# Patient Record
Sex: Male | Born: 1980 | Race: Black or African American | Hispanic: No | Marital: Single | Smoking: Current every day smoker
Health system: Southern US, Community
[De-identification: ages and names within clinical notes are randomized; demographics above are authoritative.]

## PROBLEM LIST (undated history)

## (undated) DIAGNOSIS — G8929 Other chronic pain: Secondary | ICD-10-CM

## (undated) DIAGNOSIS — M549 Dorsalgia, unspecified: Secondary | ICD-10-CM

## (undated) HISTORY — PX: FRACTURE SURGERY: SHX138

## (undated) HISTORY — PX: HIP FRACTURE SURGERY: SHX118

---

## 2012-05-17 ENCOUNTER — Ambulatory Visit: Payer: Self-pay | Admitting: Internal Medicine

## 2012-08-22 ENCOUNTER — Emergency Department: Payer: Self-pay

## 2013-11-17 ENCOUNTER — Emergency Department: Payer: Self-pay | Admitting: Emergency Medicine

## 2013-11-17 LAB — URINALYSIS, COMPLETE
BACTERIA: NONE SEEN
BILIRUBIN, UR: NEGATIVE
Blood: NEGATIVE
GLUCOSE, UR: NEGATIVE mg/dL (ref 0–75)
KETONE: NEGATIVE
Leukocyte Esterase: NEGATIVE
Nitrite: NEGATIVE
Ph: 6 (ref 4.5–8.0)
Protein: NEGATIVE
SPECIFIC GRAVITY: 1.011 (ref 1.003–1.030)
Squamous Epithelial: 1
WBC UR: <1 /HPF (ref 0–5)

## 2015-04-09 ENCOUNTER — Emergency Department: Payer: Self-pay

## 2015-04-09 ENCOUNTER — Emergency Department
Admission: EM | Admit: 2015-04-09 | Discharge: 2015-04-09 | Disposition: A | Discharge status: Home / Self Care | Payer: Self-pay | Attending: Emergency Medicine | Admitting: Emergency Medicine

## 2015-04-09 ENCOUNTER — Encounter: Payer: Self-pay | Admitting: Emergency Medicine

## 2015-04-09 DIAGNOSIS — G8929 Other chronic pain: Secondary | ICD-10-CM | POA: Insufficient documentation

## 2015-04-09 DIAGNOSIS — M79605 Pain in left leg: Secondary | ICD-10-CM | POA: Insufficient documentation

## 2015-04-09 DIAGNOSIS — M545 Low back pain: Secondary | ICD-10-CM | POA: Insufficient documentation

## 2015-04-09 LAB — GLUCOSE, CAPILLARY: GLUCOSE-CAPILLARY: 98 mg/dL (ref 65–99)

## 2015-04-09 MED ORDER — MELOXICAM 15 MG PO TABS: 15.0000 mg | ORAL_TABLET | Freq: Every day | ORAL | Status: DC
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
## 2015-04-09 NOTE — Discharge Instructions (Signed)
Follow-up with Dr. Hyacinth Meeker about your back and leg pain. He will need to call and make an appointment with the office. Also establish a doctor in this area. A list of clinics is written on your discharge papers. He'll need to call these offices and most likely fill out paperwork as they charge on a sliding scale based on your income.
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
## 2015-04-09 NOTE — ED Notes (Signed)
Reports pain in left groin and foot.  Reports hx of athletes foot and jock itch x 1 year.

## 2016-01-09 ENCOUNTER — Emergency Department
Admission: EM | Admit: 2016-01-09 | Discharge: 2016-01-09 | Disposition: A | Discharge status: Home / Self Care | Payer: Medicaid - Out of State | Attending: Emergency Medicine | Admitting: Emergency Medicine

## 2016-01-09 ENCOUNTER — Encounter: Payer: Self-pay | Admitting: Emergency Medicine

## 2016-01-09 DIAGNOSIS — S39012A Strain of muscle, fascia and tendon of lower back, initial encounter: Secondary | ICD-10-CM | POA: Insufficient documentation

## 2016-01-09 DIAGNOSIS — Y9389 Activity, other specified: Secondary | ICD-10-CM | POA: Diagnosis not present

## 2016-01-09 DIAGNOSIS — Y929 Unspecified place or not applicable: Secondary | ICD-10-CM | POA: Insufficient documentation

## 2016-01-09 DIAGNOSIS — S3992XA Unspecified injury of lower back, initial encounter: Secondary | ICD-10-CM | POA: Diagnosis present

## 2016-01-09 DIAGNOSIS — Y990 Civilian activity done for income or pay: Secondary | ICD-10-CM | POA: Diagnosis not present

## 2016-01-09 DIAGNOSIS — F17210 Nicotine dependence, cigarettes, uncomplicated: Secondary | ICD-10-CM | POA: Diagnosis not present

## 2016-01-09 DIAGNOSIS — X500XXA Overexertion from strenuous movement or load, initial encounter: Secondary | ICD-10-CM | POA: Insufficient documentation

## 2016-01-09 HISTORY — DX: Other chronic pain: G89.29

## 2016-01-09 HISTORY — DX: Dorsalgia, unspecified: M54.9

## 2016-01-09 MED ORDER — KETOROLAC TROMETHAMINE 30 MG/ML IJ SOLN
30.0000 mg | Freq: Once | INTRAMUSCULAR | Status: AC
  Administered 2016-01-09: 30 mg via INTRAMUSCULAR
  Filled 2016-01-09: qty 1

## 2016-01-09 MED ORDER — MELOXICAM 15 MG PO TABS: 15.0000 mg | ORAL_TABLET | Freq: Every day | ORAL | 0 refills | Status: AC

## 2016-01-09 MED ORDER — CYCLOBENZAPRINE HCL 10 MG PO TABS: 10.0000 mg | ORAL_TABLET | Freq: Three times a day (TID) | ORAL | 0 refills | Status: AC | PRN

## 2016-01-09 NOTE — ED Notes (Signed)
Pt states he has been taking tylenol severe cold or tylenol every 4 hrs.

## 2016-01-09 NOTE — ED Provider Notes (Signed)
[facility] Emergency Department Provider Note  ____________________________________________  Time seen: Approximately 6:42 PM  I have reviewed the triage vital signs and the nursing notes.   HISTORY  Chief Complaint Back Pain and Leg Pain    HPI David Mcclain is a 35 y.o. male who presents to emergency department complaining of lower back pain. Patient states that he has a history of recurring lower back pain. Patient states that typically he developed some spasms. Typically these are resolved with Tylenol but states that this time they've not. Patient also states that he has had some recent cold-like activity with increased coughing which she attributes to worsening the symptoms. Patient denies any radicular symptoms, bowel or bladder suction, saddle anesthesia, paresthesias. No other medications besides Tylenol and over-the-counter cold medication. No direct trauma. No other complaints at this time.   Past Medical History:  Diagnosis Date  . Chronic back pain     There are no active problems to display for this patient.   Past Surgical History:  Procedure Laterality Date  . FRACTURE SURGERY    . HIP FRACTURE SURGERY      Prior to Admission medications   Medication Sig Start Date End Date Taking? Authorizing Provider  cyclobenzaprine (FLEXERIL) 10 MG tablet Take 1 tablet (10 mg total) by mouth 3 (three) times daily as needed for muscle spasms. 01/09/16   Delorise RoyalsJonathan D Bonne Whack, PA-C  meloxicam (MOBIC) 15 MG tablet Take 1 tablet (15 mg total) by mouth daily. 01/09/16   Delorise RoyalsJonathan D Jullian Previti, PA-C    Allergies Review of patient's allergies indicates no known allergies.  History reviewed. No pertinent family history.  Social History Social History  Substance Use Topics  . Smoking status: Current Every Day Smoker    Packs/day: 0.50    Types: Cigarettes  . Smokeless tobacco: Never Used  . Alcohol use Yes     Review of Systems  Constitutional:  No fever/chills Cardiovascular: no chest pain. Respiratory: no cough. No SOB. Gastrointestinal: No abdominal pain.  No nausea, no vomiting.  No diarrhea.  No constipation. Genitourinary: Negative for dysuria. No hematuria Musculoskeletal: Positive for lower back pain Skin: Negative for rash, abrasions, lacerations, ecchymosis. Neurological: Negative for headaches, focal weakness or numbness. 10-point ROS otherwise negative.  ____________________________________________   PHYSICAL EXAM:  VITAL SIGNS: ED Triage Vitals [01/09/16 1619]  Enc Vitals Group     BP 137/83     Pulse Rate 86     Resp 18     Temp 97.8 F (36.6 C)     Temp Source Oral     SpO2 99 %     Weight 227 lb (103 kg)     Height 6\' 1"  (1.854 m)     Head Circumference      Peak Flow      Pain Score 9     Pain Loc      Pain Edu?      Excl. in GC?      Constitutional: Alert and oriented. Well appearing and in no acute distress. Eyes: Conjunctivae are normal. PERRL. EOMI. Head: Atraumatic. Cardiovascular: Normal rate, regular rhythm. Normal S1 and S2.  Good peripheral circulation. Respiratory: Normal respiratory effort without tachypnea or retractions. Lungs CTAB. Good air entry to the bases with no decreased or absent breath sounds. Gastrointestinal: Bowel sounds 4 quadrants. Soft and nontender to palpation. No guarding or rigidity. No palpable masses. No distention. No CVA tenderness. Musculoskeletal: No deformities noted to spine inspection. Full range of motion.  Patient is diffusely tender to palpation left sided paraspinal muscle group. No midline tenderness. No palpable abnormality. No tenderness to palpation of the bilateral sciatic notches. Dorsalis pedis pulse intact distally. Sensation intact and equal lower studies.  Neurologic:  Normal speech and language. No gross focal neurologic deficits are appreciated.  Skin:  Skin is warm, dry and intact. No rash noted. Psychiatric: Mood and affect are normal.  Speech and behavior are normal. Patient exhibits appropriate insight and judgement.   ____________________________________________   LABS (all labs ordered are listed, but only abnormal results are displayed)  Labs Reviewed - No data to display ____________________________________________  EKG   ____________________________________________  RADIOLOGY   No results found.  ____________________________________________    PROCEDURES  Procedure(s) performed:    Procedures    Medications  ketorolac (TORADOL) 30 MG/ML injection 30 mg (30 mg Intramuscular Given 01/09/16 1854)     ____________________________________________   INITIAL IMPRESSION / ASSESSMENT AND PLAN / ED COURSE  Pertinent labs & imaging results that were available during my care of the patient were reviewed by me and considered in my medical decision making (see chart for details).  Review of the [place] CSRS was performed in accordance of the [facility] prior to dispensing any controlled drugs.  Clinical Course    Patient's diagnosis is consistent with Lumbar strain. Patient is exam is reassuring and no indication for imaging at this time. Patient will be given Toradol injection in the emergency department.. Patient will be discharged home with prescriptions for anti-inflammatories and muscle relaxer. Patient is to follow up with primary care or orthopedics as needed or otherwise directed. Patient is given ED precautions to return to the ED for any worsening or new symptoms.     ____________________________________________  FINAL CLINICAL IMPRESSION(S) / ED DIAGNOSES  Final diagnoses:  Strain of lumbar region, initial encounter      NEW MEDICATIONS STARTED DURING THIS VISIT:  New Prescriptions   CYCLOBENZAPRINE (FLEXERIL) 10 MG TABLET    Take 1 tablet (10 mg total) by mouth 3 (three) times daily as needed for muscle spasms.   MELOXICAM (MOBIC) 15 MG TABLET    Take 1 tablet (15 mg total) by mouth  daily.        This chart was dictated using voice recognition software/Dragon. Despite best efforts to proofread, errors can occur which can change the meaning. Any change was purely unintentional.    Racheal PatchesJonathan D Michiel Sivley, PA-C 01/09/16 1856    Jene Everyobert Kinner, MD 01/09/16 2048

## 2016-01-09 NOTE — ED Triage Notes (Signed)
Pt reports he has been having upper and lower back pain for several days. Pt states the pain improves with ambulation and states the pain resembles cramping and soreness.  Pt states he does exercise and lift weights frequently. 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  Pt missed work today.  Pt has been taking Tylenol or Aleve for pain. Last dose of tylenol was at 2pm today.

## 2016-03-09 ENCOUNTER — Encounter: Payer: Self-pay | Admitting: Emergency Medicine

## 2016-03-09 DIAGNOSIS — J302 Other seasonal allergic rhinitis: Secondary | ICD-10-CM | POA: Diagnosis not present

## 2016-03-09 DIAGNOSIS — Z79899 Other long term (current) drug therapy: Secondary | ICD-10-CM | POA: Diagnosis not present

## 2016-03-09 DIAGNOSIS — F17210 Nicotine dependence, cigarettes, uncomplicated: Secondary | ICD-10-CM | POA: Insufficient documentation

## 2016-03-09 DIAGNOSIS — R0981 Nasal congestion: Secondary | ICD-10-CM | POA: Diagnosis present

## 2016-03-09 NOTE — ED Triage Notes (Signed)
Pt ambulatory to triage with c/o nasal congestion and chills for 2 years. Pt states "I have been going back and I think I have a fungal infection, I had athletes foot and jock itch. 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

## 2016-03-10 ENCOUNTER — Emergency Department
Admission: EM | Admit: 2016-03-10 | Discharge: 2016-03-10 | Disposition: A | Discharge status: Home / Self Care | Payer: Medicaid - Out of State | Attending: Emergency Medicine | Admitting: Emergency Medicine

## 2016-03-10 DIAGNOSIS — J302 Other seasonal allergic rhinitis: Secondary | ICD-10-CM

## 2016-03-10 MED ORDER — LORATADINE 10 MG PO TABS: 10.0000 mg | ORAL_TABLET | Freq: Every day | ORAL | 0 refills | Status: AC

## 2016-03-10 NOTE — ED Notes (Signed)
Pt. Reports cold/flu like symptoms for the past two years.  Pt. States some relief with OTC medications.  Pt. Does not report sinus congestion at this time.

## 2016-03-10 NOTE — ED Provider Notes (Signed)
[facility] Emergency Department Provider Note   First MD Initiated Contact with Patient 03/10/16 0157     (approximate)  I have reviewed the triage vital signs and the nursing notes.   HISTORY  Chief Complaint Nasal Congestion and Chills   HPI David Mcclain is a 35 y.o. male patient presents to the emergency department with cold-like symptoms intermittently 2 years. Patient denies any current congestion cough fever chills. Patient does state that however intermittently he does get congested   Past Medical History:  Diagnosis Date  . Chronic back pain     There are no active problems to display for this patient.   Past Surgical History:  Procedure Laterality Date  . FRACTURE SURGERY    . HIP FRACTURE SURGERY      Prior to Admission medications   Medication Sig Start Date End Date Taking? Authorizing Provider  cyclobenzaprine (FLEXERIL) 10 MG tablet Take 1 tablet (10 mg total) by mouth 3 (three) times daily as needed for muscle spasms. 01/09/16   Delorise RoyalsJonathan D Cuthriell, PA-C  loratadine (CLARITIN) 10 MG tablet Take 1 tablet (10 mg total) by mouth daily. 03/10/16   Darci Currentandolph N Brown, MD  meloxicam (MOBIC) 15 MG tablet Take 1 tablet (15 mg total) by mouth daily. 01/09/16   Delorise RoyalsJonathan D Cuthriell, PA-C    Allergies Patient has no known allergies.  No family history on file.  Social History Social History  Substance Use Topics  . Smoking status: Current Every Day Smoker    Packs/day: 0.50    Types: Cigarettes  . Smokeless tobacco: Never Used  . Alcohol use Yes    Review of Systems Constitutional: No fever/chills Eyes: No visual changes. ENT: No sore throat. Cardiovascular: Denies chest pain. Respiratory: Denies shortness of breath. Gastrointestinal: No abdominal pain.  No nausea, no vomiting.  No diarrhea.  No constipation. Genitourinary: Negative for dysuria. Musculoskeletal: Negative for back pain. Skin: Negative for  rash. Neurological: Negative for headaches, focal weakness or numbness.  10-point ROS otherwise negative.  ____________________________________________   PHYSICAL EXAM:  VITAL SIGNS: ED Triage Vitals  Enc Vitals Group     BP 03/09/16 2327 (!) 152/95     Pulse Rate 03/09/16 2327 94     Resp 03/09/16 2327 18     Temp 03/09/16 2327 98 F (36.7 C)     Temp Source 03/09/16 2327 Oral     SpO2 03/09/16 2327 97 %     Weight 03/09/16 2329 210 lb (95.3 kg)     Height 03/09/16 2329 6\' 1"  (1.854 m)     Head Circumference --      Peak Flow --      Pain Score 03/09/16 2329 3     Pain Loc --      Pain Edu? --      Excl. in GC? --     Constitutional: Alert and oriented. Well appearing and in no acute distress. Eyes: Conjunctivae are normal. PERRL. EOMI. Head: Atraumatic. Ears:  Healthy appearing ear canals and TMs bilaterally Nose: No congestion/rhinnorhea. Mouth/Throat: Mucous membranes are moist.  Oropharynx non-erythematous. Neck: No stridor.   Cardiovascular: Normal rate, regular rhythm. Good peripheral circulation. Grossly normal heart sounds. Respiratory: Normal respiratory effort.  No retractions. Lungs CTAB. Gastrointestinal: Soft and nontender. No distention.  Musculoskeletal: No lower extremity tenderness nor edema. No gross deformities of extremities. Neurologic:  Normal speech and language. No gross focal neurologic deficits are appreciated.  Skin:  Skin is warm, dry and intact.  No rash noted.    Procedures     INITIAL IMPRESSION / ASSESSMENT AND PLAN / ED COURSE  Pertinent labs & imaging results that were available during my care of the patient were reviewed by me and considered in my medical decision making (see chart for details).     Clinical Course     ____________________________________________  FINAL CLINICAL IMPRESSION(S) / ED DIAGNOSES  Final diagnoses:  Acute seasonal allergic rhinitis, unspecified trigger     MEDICATIONS GIVEN DURING THIS  VISIT:  Medications - No data to display   NEW OUTPATIENT MEDICATIONS STARTED DURING THIS VISIT:  New Prescriptions   LORATADINE (CLARITIN) 10 MG TABLET    Take 1 tablet (10 mg total) by mouth daily.    Modified Medications   No medications on file    Discontinued Medications   No medications on file     Note:  This document was prepared using Dragon voice recognition software and may include unintentional dictation errors.    Darci Currentandolph N Brown, MD 03/10/16 [phone]

## 2016-03-10 NOTE — ED Notes (Signed)
Pt. Going home by self. 

## 2016-11-09 ENCOUNTER — Emergency Department
Admission: EM | Admit: 2016-11-09 | Discharge: 2016-11-09 | Disposition: A | Discharge status: Home / Self Care | Payer: Medicaid - Out of State | Attending: Emergency Medicine | Admitting: Emergency Medicine

## 2016-11-09 ENCOUNTER — Emergency Department: Payer: Medicaid - Out of State

## 2016-11-09 DIAGNOSIS — F17210 Nicotine dependence, cigarettes, uncomplicated: Secondary | ICD-10-CM | POA: Diagnosis not present

## 2016-11-09 DIAGNOSIS — M549 Dorsalgia, unspecified: Secondary | ICD-10-CM | POA: Diagnosis not present

## 2016-11-09 DIAGNOSIS — Z79899 Other long term (current) drug therapy: Secondary | ICD-10-CM | POA: Insufficient documentation

## 2016-11-09 DIAGNOSIS — R05 Cough: Secondary | ICD-10-CM | POA: Diagnosis present

## 2016-11-09 DIAGNOSIS — J189 Pneumonia, unspecified organism: Secondary | ICD-10-CM | POA: Diagnosis not present

## 2016-11-09 DIAGNOSIS — G8929 Other chronic pain: Secondary | ICD-10-CM | POA: Insufficient documentation

## 2016-11-09 MED ORDER — LEVOFLOXACIN 500 MG PO TABS: 500.0000 mg | ORAL_TABLET | Freq: Every day | ORAL | 0 refills | Status: AC

## 2016-11-09 MED ORDER — LEVOFLOXACIN 500 MG PO TABS
500.0000 mg | ORAL_TABLET | Freq: Once | ORAL | Status: AC
  Administered 2016-11-09: 500 mg via ORAL
  Filled 2016-11-09: qty 1

## 2016-11-09 MED ORDER — AZITHROMYCIN 500 MG PO TABS: 500.0000 mg | ORAL_TABLET | Freq: Every day | ORAL | 0 refills | Status: AC

## 2016-11-09 NOTE — ED Provider Notes (Signed)
[facility] Emergency Department Provider Note   First MD Initiated Contact with Patient 11/09/16 [phone]     (approximate)  I have reviewed the triage vital signs and the nursing notes.   HISTORY  Chief Complaint Back Pain    HPI David Mcclain is a 36 y.o. male with history of sciatica presents to the emergency department with chest congestion, chills times approximately 3 days. Patient denies any fever. Patient does admit to a nonproductive cough in the morning. Patient also admits to malodorous scent coming from his air conditioning unit for "a while". Patient denies any chest pain. Patient denies any E pain or swelling.   Past Medical History:  Diagnosis Date  . Chronic back pain     There are no active problems to display for this patient.   Past Surgical History:  Procedure Laterality Date  . FRACTURE SURGERY    . HIP FRACTURE SURGERY      Prior to Admission medications   Medication Sig Start Date End Date Taking? Authorizing Provider  azithromycin (ZITHROMAX) 500 MG tablet Take 1 tablet (500 mg total) by mouth daily. Take 1 tablet daily for 3 days. 11/09/16 11/12/16  Darci Current, MD  cyclobenzaprine (FLEXERIL) 10 MG tablet Take 1 tablet (10 mg total) by mouth 3 (three) times daily as needed for muscle spasms. 01/09/16   Cuthriell, Delorise Royals, PA-C  levofloxacin (LEVAQUIN) 500 MG tablet Take 1 tablet (500 mg total) by mouth daily. 11/09/16 11/19/16  Darci Current, MD  levofloxacin (LEVAQUIN) 500 MG tablet Take 1 tablet (500 mg total) by mouth daily. 11/09/16 11/19/16  Darci Current, MD  loratadine (CLARITIN) 10 MG tablet Take 1 tablet (10 mg total) by mouth daily. 03/10/16   Darci Current, MD  meloxicam (MOBIC) 15 MG tablet Take 1 tablet (15 mg total) by mouth daily. 01/09/16   Cuthriell, Delorise Royals, PA-C    Allergies No known drug allergies  No family history on file.  Social History Social History  Substance Use Topics  .  Smoking status: Current Every Day Smoker    Packs/day: 0.50    Types: Cigarettes  . Smokeless tobacco: Never Used  . Alcohol use Yes    Review of Systems Constitutional: Positive for chills Eyes: No visual changes. ENT: No sore throat. Cardiovascular: Denies chest pain. Respiratory: Denies shortness of breath.Positive for cough and dyspnea Gastrointestinal: No abdominal pain.  No nausea, no vomiting.  No diarrhea.  No constipation. Genitourinary: Negative for dysuria. Musculoskeletal: Negative for neck pain.  Negative for back pain. Integumentary: Negative for rash. Neurological: Negative for headaches, focal weakness or numbness.   ____________________________________________   PHYSICAL EXAM:  VITAL SIGNS: ED Triage Vitals  Enc Vitals Group     BP 11/09/16 0031 134/89     Pulse Rate 11/09/16 0031 81     Resp 11/09/16 0031 18     Temp 11/09/16 0031 98.2 F (36.8 C)     Temp Source 11/09/16 0031 Oral     SpO2 11/09/16 0031 100 %     Weight 11/09/16 0032 103 kg (227 lb)     Height 11/09/16 0032 1.854 m (6\' 1" )     Head Circumference --      Peak Flow --      Pain Score 11/09/16 0030 2     Pain Loc --      Pain Edu? --      Excl. in GC? --     Constitutional: Alert  and oriented. Well appearing and in no acute distress. Eyes: Conjunctivae are normal.  Head: Atraumatic. Nose: No congestion/rhinnorhea. Mouth/Throat: Mucous membranes are moist.  Oropharynx non-erythematous. Neck: No stridor.  Cardiovascular: Normal rate, regular rhythm. Good peripheral circulation. Grossly normal heart sounds. Respiratory: Normal respiratory effort.  No retractions. Diffuse rhonchi Gastrointestinal: Soft and nontender. No distention.  Musculoskeletal: No lower extremity tenderness nor edema. No gross deformities of extremities. Neurologic:  Normal speech and language. No gross focal neurologic deficits are appreciated.  Skin:  Skin is warm, dry and intact. No rash noted. Psychiatric:  Mood and affect are normal. Speech and behavior are normal.   RADIOLOGY I, Lares N Ariani Seier, personally viewed and evaluated these images (plain radiographs) as part of my medical decision making, as well as reviewing the written report by the radiologist.  Dg Chest 2 View  Result Date: 11/09/2016 CLINICAL DATA:  Chest tightness and congestion tonight EXAM: CHEST  2 VIEW COMPARISON:  None. FINDINGS: Diffuse small nodules, upper lobe predominant. This may be infectious or inflammatory. Neoplasm less likely. Relative sparing of the bases. Hilar, mediastinal and cardiac contours are unremarkable. Normal pulmonary vasculature. No pleural effusions. IMPRESSION: Patchy and small nodular opacities, upper lobe predominant. Infectious or inflammatory etiologies are most likely. Electronically Signed   By: Ellery Plunkaniel R Mitchell M.D.   On: 11/09/2016 06:15   Ct Chest Wo Contrast  Result Date: 11/09/2016 CLINICAL DATA:  Chronic lower back pain.  Chest and shoulder pain. EXAM: CT CHEST WITHOUT CONTRAST TECHNIQUE: Multidetector CT imaging of the chest was performed following the standard protocol without IV contrast. COMPARISON:  Radiographs [id number] FINDINGS: Cardiovascular: Normal heart and aorta.  No pericardial effusion. Mediastinum/Nodes: Bilateral hilar adenopathy. Mildly prominent mediastinal nodes. Lungs/Pleura: Multifocal patchy and nodular opacities, upper lobe predominant. This could be infectious or inflammatory. Neoplasm less likely but not entirely excluded. Airways are normal in caliber and patent. No pleural effusion. Upper Abdomen: No significant abnormality. Musculoskeletal: No significant skeletal abnormality. IMPRESSION: Scattered patchy and nodular airspace opacities bilaterally, upper lobe predominant. Bilateral hilar adenopathy may be reactive. This may represent an atypical infectious process. Neoplasm less likely. Electronically Signed   By: Ellery Plunkaniel R Mitchell M.D.   On: 11/09/2016 06:45       Procedures   ____________________________________________   INITIAL IMPRESSION / ASSESSMENT AND PLAN / ED COURSE  Pertinent labs & imaging results that were available during my care of the patient were reviewed by me and considered in my medical decision making (see chart for details).  36 year old male presenting with chills cough and chest congestion with above findings noted on chest x-ray. CT scan of chest perform for better visualization of chest x-ray findings. Patient will be prescribed Levaquin and azithromycin. I spoke with the patient at length regarding necessity of following up with pulmonologist. Concern for possible fungal etiology as well and a such patient states urged to follow up with Dr. Meredeth IdeFleming      ____________________________________________  FINAL CLINICAL IMPRESSION(S) / ED DIAGNOSES  Final diagnoses:  Atypical pneumonia     MEDICATIONS GIVEN DURING THIS VISIT:  Medications  levofloxacin (LEVAQUIN) tablet 500 mg (500 mg Oral Given 11/09/16 0705)     NEW OUTPATIENT MEDICATIONS STARTED DURING THIS VISIT:  Discharge Medication List as of 11/09/2016  6:58 AM    START taking these medications   Details  azithromycin (ZITHROMAX) 500 MG tablet Take 1 tablet (500 mg total) by mouth daily. Take 1 tablet daily for 3 days., Starting Wed 11/09/2016, Until Sat 11/12/2016, Print    !!  levofloxacin (LEVAQUIN) 500 MG tablet Take 1 tablet (500 mg total) by mouth daily., Starting Wed 11/09/2016, Until Sat 11/19/2016, Print    !! levofloxacin (LEVAQUIN) 500 MG tablet Take 1 tablet (500 mg total) by mouth daily., Starting Wed 11/09/2016, Until Sat 11/19/2016, Print     !! - Potential duplicate medications found. Please discuss with provider.      Discharge Medication List as of 11/09/2016  6:58 AM      Discharge Medication List as of 11/09/2016  6:58 AM       Note:  This document was prepared using Dragon voice recognition software and may include  unintentional dictation errors.    Darci Current, MD 11/09/16 [id number]

## 2016-11-09 NOTE — ED Notes (Signed)
Patient having multiple issues. Patient having lower back pain which is chronic due to sciatica. Patient having cold like symptoms, having shoulder pain and chest pain that is relieved with doing push ups. Patient NAD. Patient is taking a lot of cold medication and multiple allergy pills a day. Patient [facility] has a "weird" smell and is concerned it is mold.

## 2016-11-09 NOTE — ED Triage Notes (Signed)
Patient to ED for "traveling pain." States he has been here a lot and the pain is not going away. He has pain that starts in his leg, goes to his back and then sciatica. Patient does not have a PCP "that 's why I'm here all the time".

## 2018-07-10 ENCOUNTER — Encounter: Payer: Self-pay | Admitting: Emergency Medicine

## 2018-07-10 ENCOUNTER — Other Ambulatory Visit: Payer: Self-pay

## 2018-07-10 ENCOUNTER — Emergency Department: Payer: Commercial Managed Care - PPO

## 2018-07-10 ENCOUNTER — Emergency Department
Admission: EM | Admit: 2018-07-10 | Discharge: 2018-07-11 | Disposition: A | Discharge status: Home / Self Care | Payer: Commercial Managed Care - PPO | Attending: Emergency Medicine | Admitting: Emergency Medicine

## 2018-07-10 DIAGNOSIS — R05 Cough: Secondary | ICD-10-CM | POA: Diagnosis not present

## 2018-07-10 DIAGNOSIS — F17210 Nicotine dependence, cigarettes, uncomplicated: Secondary | ICD-10-CM | POA: Diagnosis not present

## 2018-07-10 DIAGNOSIS — R0602 Shortness of breath: Secondary | ICD-10-CM | POA: Diagnosis present

## 2018-07-10 NOTE — ED Notes (Signed)
XR at bedside

## 2018-07-10 NOTE — ED Triage Notes (Signed)
Patient ambulatory to triage with steady gait, without difficulty or distress noted, mask in place; pt reports having frequent cold symptoms and SHOB x year with occas cough and congestion; st hx of same with allergies

## 2018-07-11 MED ORDER — ALBUTEROL SULFATE HFA 108 (90 BASE) MCG/ACT IN AERS: INHALATION_SPRAY | RESPIRATORY_TRACT | 1 refills | Status: AC

## 2018-07-11 NOTE — ED Notes (Signed)
Pt verbalized understanding of d/c instructions, RX, and f/u care. No further questions at this time. Pt ambulatory to the exit with steady gait  

## 2018-07-11 NOTE — ED Provider Notes (Addendum)
[facility] Emergency Department Provider Note  ____________________________________________   First MD Initiated Contact with Patient 07/10/18 2341     (approximate)  I have reviewed the triage vital signs and the nursing notes.   HISTORY  Chief Complaint Shortness of Breath    HPI David Mcclain is a 38 y.o. male with medical history as listed below who presents for evaluation of intermittent shortness of breath with mild cough that is been going on for about a year.  He states he was seen in 2018 for similar symptoms and got a couple of antibiotics.  The symptoms got better but intermittently he has issues that he thinks are associated with his allergies.  He felt little bit more short of breath today although nothing in particular made it better or worse.  He said he thought he should get it checked out because he knew that there is probably less people in the emergency department right now than usual.  He has been trying to isolate himself during the COVID-19 pandemic.  He denies fever/chills, sore throat, chest pain, nausea, vomiting, and abdominal pain.  He smokes daily and frequently has a mild occasionally productive cough.  He has some episodes of shortness of breath from time to time over the last year.  He is in no distress at this time and says he does not currently feel short of breath.         Past Medical History:  Diagnosis Date  . Chronic back pain     There are no active problems to display for this patient.   Past Surgical History:  Procedure Laterality Date  . FRACTURE SURGERY    . HIP FRACTURE SURGERY      Prior to Admission medications   Medication Sig Start Date End Date Taking? Authorizing Provider  albuterol (VENTOLIN HFA) 108 (90 Base) MCG/ACT inhaler Inhale 2-4 puffs by mouth every 4 hours as needed for wheezing, cough, and/or shortness of breath 07/11/18   Loleta Rose, MD  cyclobenzaprine (FLEXERIL) 10 MG tablet Take 1  tablet (10 mg total) by mouth 3 (three) times daily as needed for muscle spasms. 01/09/16   Cuthriell, Delorise Royals, PA-C  loratadine (CLARITIN) 10 MG tablet Take 1 tablet (10 mg total) by mouth daily. 03/10/16   Darci Current, MD  meloxicam (MOBIC) 15 MG tablet Take 1 tablet (15 mg total) by mouth daily. 01/09/16   Cuthriell, Delorise Royals, PA-C    Allergies Patient has no known allergies.  No family history on file.  Social History Social History   Tobacco Use  . Smoking status: Current Every Day Smoker    Packs/day: 0.50    Types: Cigarettes  . Smokeless tobacco: Never Used  Substance Use Topics  . Alcohol use: Yes  . Drug use: Not on file    Review of Systems Constitutional: No fever/chills Eyes: No visual changes. ENT: No sore throat. Cardiovascular: Denies chest pain. Respiratory: Intermittent shortness of breath and cough for about a year. Gastrointestinal: No abdominal pain.  No nausea, no vomiting.  No diarrhea.  No constipation. Genitourinary: Negative for dysuria. Musculoskeletal: Negative for neck pain.  Negative for back pain. Integumentary: Negative for rash. Neurological: Negative for headaches, focal weakness or numbness.   ____________________________________________   PHYSICAL EXAM:  VITAL SIGNS: ED Triage Vitals  Enc Vitals Group     BP 07/10/18 2230 132/89     Pulse Rate 07/10/18 2230 87     Resp 07/10/18 2230 18  Temp 07/10/18 2230 98.4 F (36.9 C)     Temp Source 07/10/18 2230 Oral     SpO2 07/10/18 2230 97 %     Weight 07/10/18 2231 97.5 kg (215 lb)     Height 07/10/18 2231 1.854 m ( )     Head Circumference --      Peak Flow --      Pain Score 07/10/18 2229 0     Pain Loc --      Pain Edu? --      Excl. in GC? --     Constitutional: Alert and oriented. Well appearing and in no acute distress. Eyes: Conjunctivae are normal.  Head: Atraumatic. Nose: No congestion/rhinnorhea. Mouth/Throat: Mucous membranes are moist. Neck:  No stridor.  No meningeal signs.   Cardiovascular: Normal rate, regular rhythm. Good peripheral circulation. Grossly normal heart sounds. Respiratory: Normal respiratory effort.  No retractions. No audible wheezing. Gastrointestinal: Soft and nontender. No distention.  Musculoskeletal: No lower extremity tenderness nor edema. No gross deformities of extremities. Neurologic:  Normal speech and language. No gross focal neurologic deficits are appreciated.  Skin:  Skin is warm, dry and intact. No rash noted. Psychiatric: Mood and affect are normal. Speech and behavior are normal.  ____________________________________________   LABS (all labs ordered are listed, but only abnormal results are displayed)  Labs Reviewed - No data to display ____________________________________________  EKG  ED ECG REPORT I, Loleta Rose, the attending physician, personally viewed and interpreted this ECG.  Date: 07/10/2018 EKG Time: 22:31 Rate: 98 Rhythm: normal sinus rhythm QRS Axis: normal Intervals: normal ST/T Wave abnormalities: Non-specific ST segment / T-wave changes, but no clear evidence of acute ischemia. Narrative Interpretation: no definitive evidence of acute ischemia; does not meet STEMI criteria.   ____________________________________________  RADIOLOGY I, Loleta Rose, personally viewed and evaluated these images (plain radiographs) as part of my medical decision making, as well as reviewing the written report by the radiologist.  ED MD interpretation: Improved x-ray compared to prior from 2018.  No indication of acute abnormality.  Official radiology report(s): Dg Chest Portable 1 View  Result Date: 07/10/2018 CLINICAL DATA:  Intermittent cough shortness of breath for 1 year. EXAM: PORTABLE CHEST 1 VIEW COMPARISON:  11/09/2016 FINDINGS: The heart size and mediastinal contours are within normal limits. Previously seen small patchy nodular opacity both upper lobes show improvement  since previous study. No new or worsening areas of pulmonary opacity are seen. No evidence of pleural effusion. IMPRESSION: Interval improvement in bilateral upper lobe patchy nodular opacities since prior exam. No acute findings. Electronically Signed   By: Myles Rosenthal M.D.   On: 07/10/2018 23:35    ____________________________________________   PROCEDURES   Procedure(s) performed (including Critical Care):  Procedures   ____________________________________________   INITIAL IMPRESSION / MDM / ASSESSMENT AND PLAN / ED COURSE  As part of my medical decision making, I reviewed the following data within the electronic MEDICAL RECORD NUMBER Nursing notes reviewed and incorporated, Old chart reviewed, Radiograph reviewed , Notes from prior ED visits and [place] Controlled Substance Database      David Mcclain was evaluated in Emergency Department on 07/11/2018 for the symptoms described in the history of present illness. He was evaluated in the context of the global COVID-19 pandemic, which necessitated consideration that the patient might be at risk for infection with the SARS-CoV-2 virus that causes COVID-19. Institutional protocols and algorithms that pertain to the evaluation of patients at risk for COVID-19 are in a state  of rapid change based on information released by regulatory bodies including the CDC and federal and state organizations. These policies and algorithms were followed during the patient's care in the ED.  Differential diagnosis includes, but is not limited to, allergies, viral infection, COVID-19, pneumonia.  The patient is well-appearing and in no distress with normal vital signs.  He is having no dyspnea at this time.  He is a daily smoker and I suspect he suffers both from some tobacco related cough as well as seasonal allergies.  He says he feels better when he takes his daily Claritin, but "I almost never take it unless I start feeling bad".  I encouraged him to continue taking  his daily allergy medicine and given his high probability of developing COPD if he does not already have it, I am giving him a prescription for an inhaler.  His chest x-ray is reassuring compared to the last one.  He is low risk for COVID-19 and I did offer him the send out test just to verify that he is negative, but he does not want the swab in his nose and says that his symptoms have been about the same for a year and there is no reason to think he has it.  I agree but I continue to support the notion that he should isolate himself from others.  I gave him my usual customary return precautions and he understands and agrees with the plan.      ____________________________________________  FINAL CLINICAL IMPRESSION(S) / ED DIAGNOSES  Final diagnoses:  Shortness of breath     MEDICATIONS GIVEN DURING THIS VISIT:  Medications - No data to display   ED Discharge Orders         Ordered    albuterol (VENTOLIN HFA) 108 (90 Base) MCG/ACT inhaler     07/11/18 0022           Note:  This document was prepared using Dragon voice recognition software and may include unintentional dictation errors.   Loleta RoseForbach, Enyah Moman, MD 07/11/18 Moses [id number]    Loleta RoseForbach, Damondre Pfeifle, MD 07/16/18 [phone]

## 2018-07-11 NOTE — ED Notes (Signed)
Provider at bedside

## 2018-07-11 NOTE — Discharge Instructions (Addendum)
As we discussed, your symptoms are most likely the result of your smoking and some seasonal allergies.  I recommend you continue to take your Claritin daily.  I also wrote a prescription for an albuterol inhaler which you can use as needed.  We offered COVID-19 testing which she declined at this time and I think that is reasonable given that you have no particular risk factors.  We recommend you continue to try to isolate from others during this pandemic.  Return to the emergency department if you develop new or worsening symptoms that concern you.

## 2018-09-20 ENCOUNTER — Telehealth: Payer: Self-pay

## 2018-09-20 NOTE — Telephone Encounter (Signed)
Copied from [place] [phone]. Topic: General - Other >> Sep 19, 2018  4:49 PM Ivar Drape wrote: Reason for CRM:   Patient would like a call back to make a New Patient appt with a male provider.

## 2018-10-04 ENCOUNTER — Telehealth: Payer: Self-pay | Admitting: Internal Medicine

## 2018-10-04 NOTE — Telephone Encounter (Signed)
I called pt to schedule NP appt. Pt insurance was not going into epic pt was being rude example reading numbers off card fast, mouth away from phone, rude comment. I asked pt if he could bring card into office. But I did say to pt that he wont be able to come into the office for appt he then says well so you mean to tell me I can't come into the office but I can bring a card in. I ask pt to hold on and says im not playing no games with you an hung up.

## 2019-01-03 IMAGING — CT CT CHEST W/O CM
2 of 3 series · 15 of 36 positions shown, 18 images · non-contrast
Comparison: Radiographs [id number]

CLINICAL DATA: Chronic lower back pain.  Chest and shoulder pain.

EXAM:
CT CHEST WITHOUT CONTRAST
TECHNIQUE: Multidetector CT imaging of the chest was performed following the
standard protocol without IV contrast.

[Series 2: thorax · axial · 0.77mm/px · z∈[-525,-259]mm · 12 of 157 slices shown, 15 images]
[im 12/157  mediastinal]
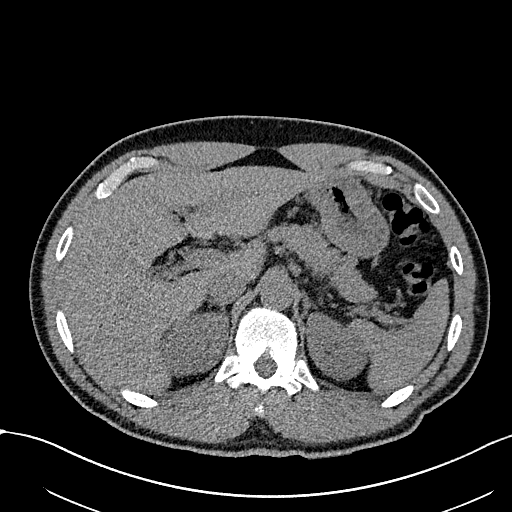
[im 12/157  lung]
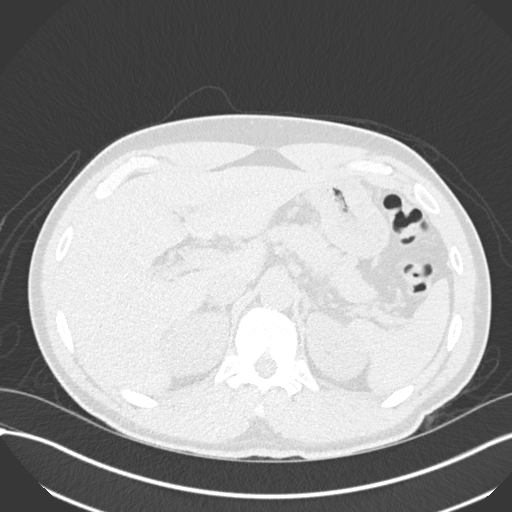
[im 24/157  lung]
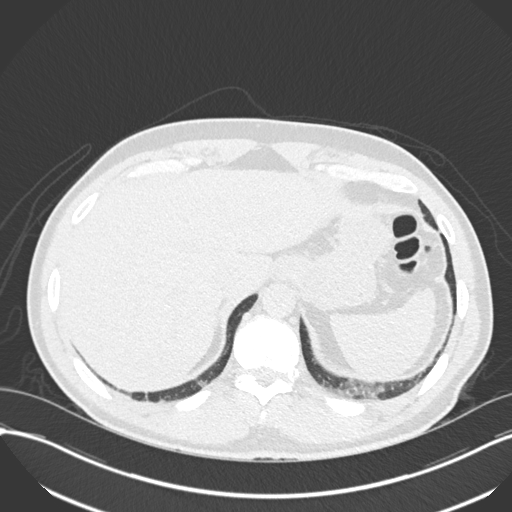
[im 35/157  lung]
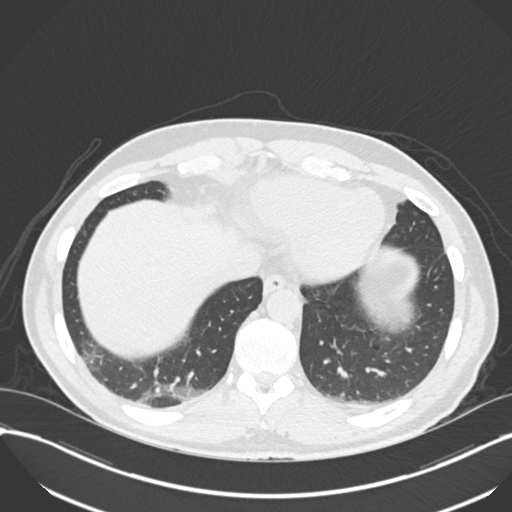
[im 47/157  lung]
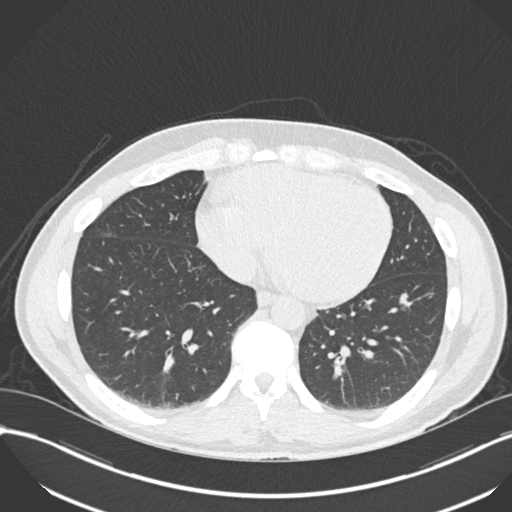
[im 58/157  mediastinal]
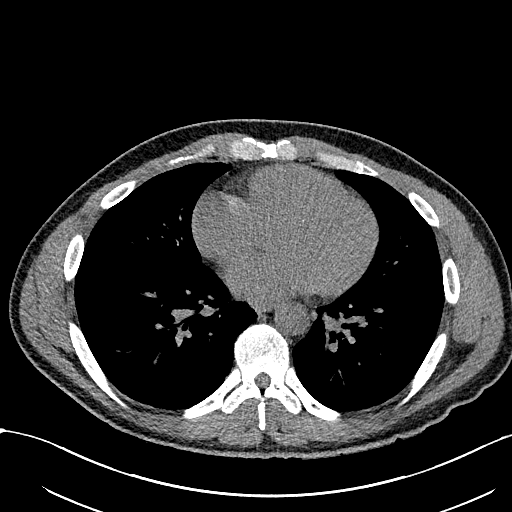
[im 58/157  lung]
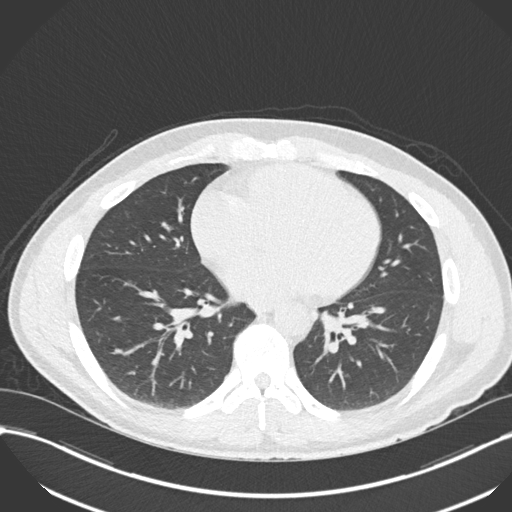
[im 70/157  lung]
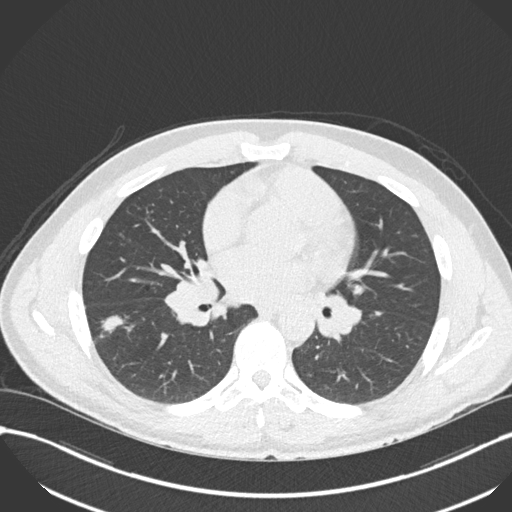
[im 87/157  lung]
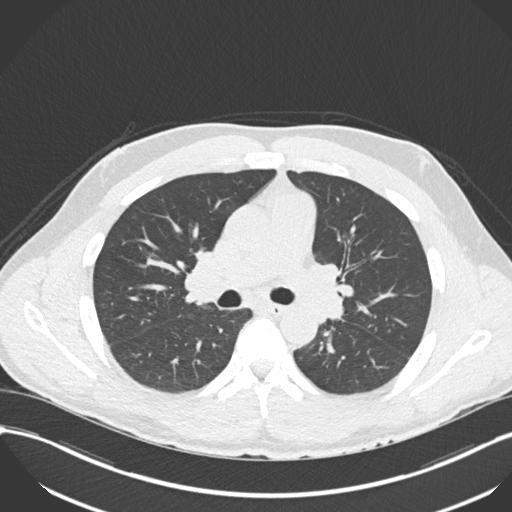
[im 99/157  lung]
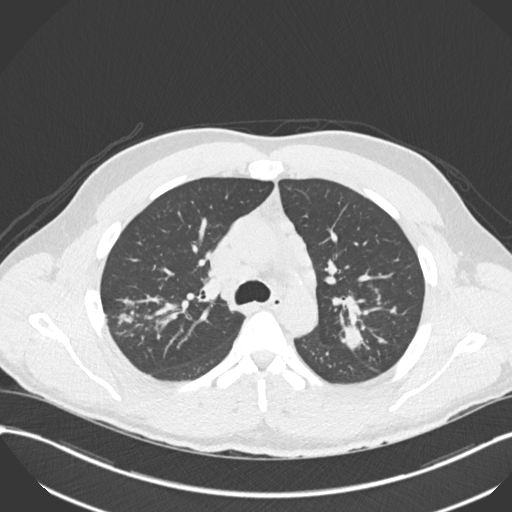
[im 110/157  mediastinal]
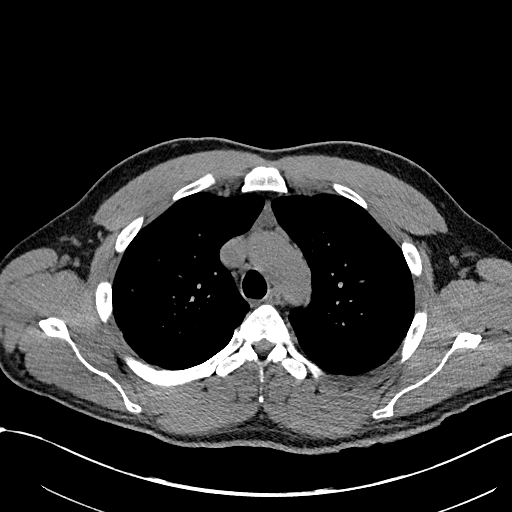
[im 110/157  lung]
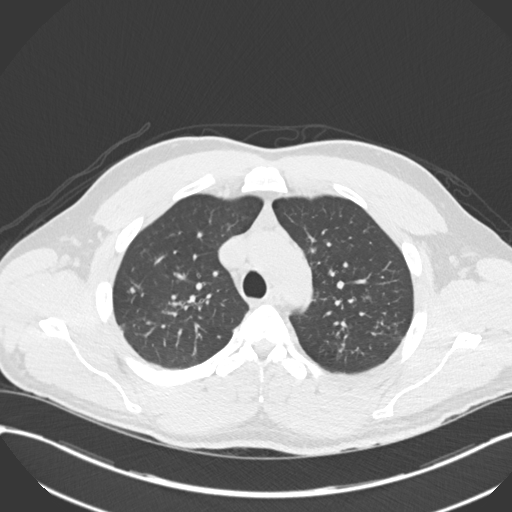
[im 122/157  lung]
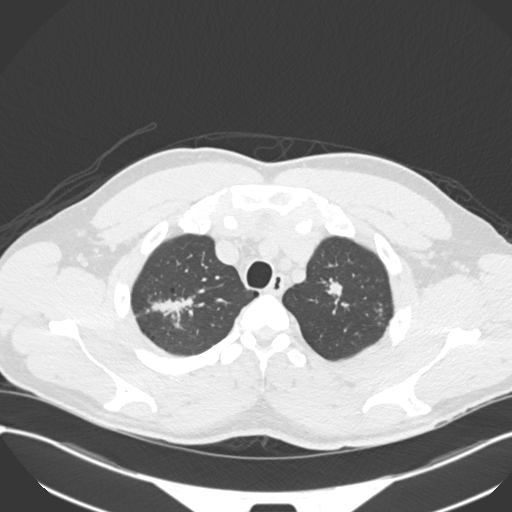
[im 133/157  lung]
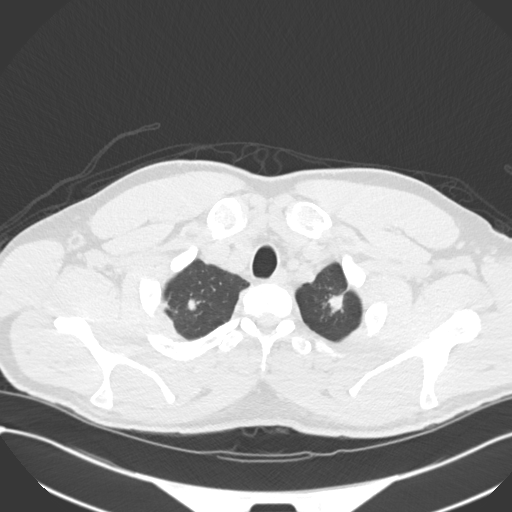
[im 145/157  lung]
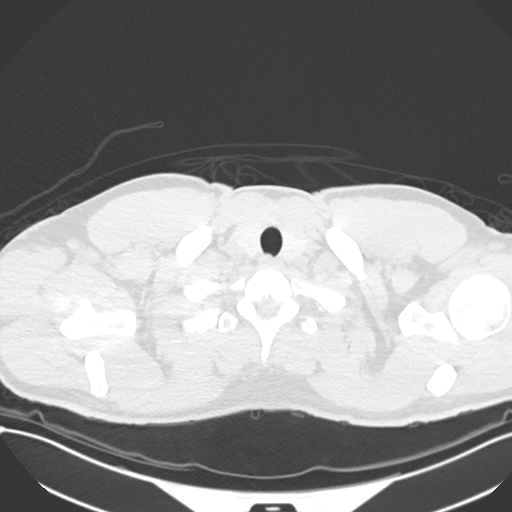

[Series 5: coronal · coronal · 0.67mm/px · 3 of 121 slices shown]
[im 25/121  lung]
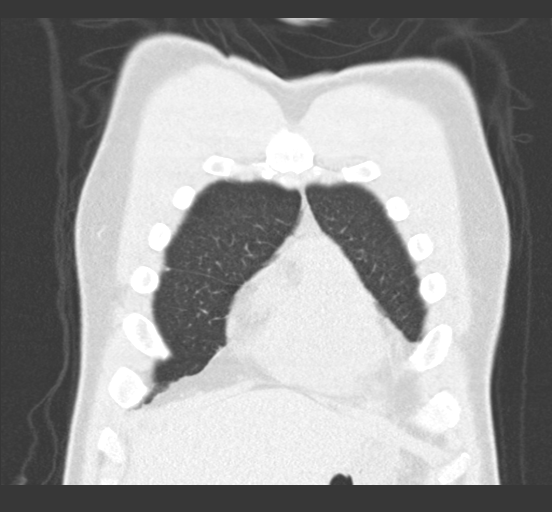
[im 49/121  lung]
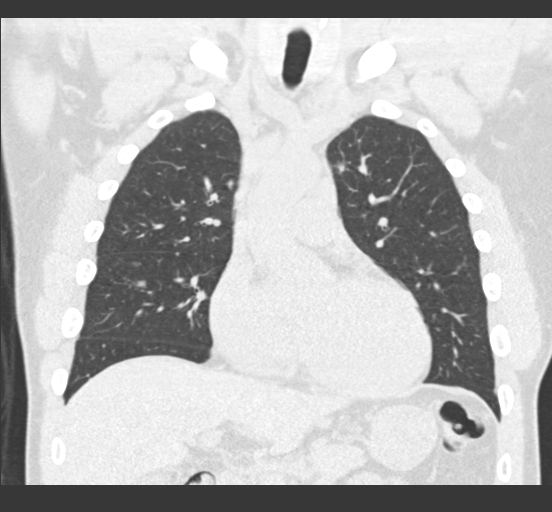
[im 73/121  lung]
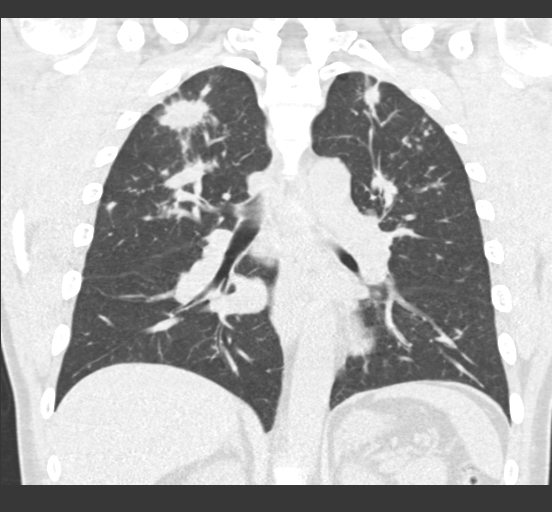

[15 of 36 positions shown; findings below may reference images not displayed]

FINDINGS: Cardiovascular: Normal heart and aorta.  No pericardial effusion.

Mediastinum/Nodes: Bilateral hilar adenopathy. Mildly prominent
mediastinal nodes.

Lungs/Pleura: Multifocal patchy and nodular opacities, upper lobe
predominant. This could be infectious or inflammatory. Neoplasm less
likely but not entirely excluded. Airways are normal in caliber and
patent. No pleural effusion.

Upper Abdomen: No significant abnormality.

Musculoskeletal: No significant skeletal abnormality.
IMPRESSION: Scattered patchy and nodular airspace opacities bilaterally, upper
lobe predominant. Bilateral hilar adenopathy may be reactive. This
may represent an atypical infectious process. Neoplasm less likely.

## 2020-07-25 ENCOUNTER — Other Ambulatory Visit: Payer: Self-pay

## 2020-07-25 DIAGNOSIS — X58XXXA Exposure to other specified factors, initial encounter: Secondary | ICD-10-CM | POA: Insufficient documentation

## 2020-07-25 DIAGNOSIS — F17210 Nicotine dependence, cigarettes, uncomplicated: Secondary | ICD-10-CM | POA: Insufficient documentation

## 2020-07-25 DIAGNOSIS — T1501XA Foreign body in cornea, right eye, initial encounter: Secondary | ICD-10-CM | POA: Insufficient documentation

## 2020-07-25 NOTE — ED Triage Notes (Signed)
L eye irritated this morning then R eye started getting irritated/watery/itching. 'I think something is in it,' denies contact use

## 2020-07-26 ENCOUNTER — Emergency Department
Admission: EM | Admit: 2020-07-26 | Discharge: 2020-07-26 | Disposition: A | Discharge status: Home / Self Care | Payer: Commercial Managed Care - PPO | Attending: Emergency Medicine | Admitting: Emergency Medicine

## 2020-07-26 DIAGNOSIS — T1591XA Foreign body on external eye, part unspecified, right eye, initial encounter: Secondary | ICD-10-CM

## 2020-07-26 MED ORDER — FLUORESCEIN SODIUM 1 MG OP STRP
1.0000 | ORAL_STRIP | Freq: Once | OPHTHALMIC | Status: AC
  Administered 2020-07-26: 1 via OPHTHALMIC
  Filled 2020-07-26: qty 1

## 2020-07-26 MED ORDER — TETRACAINE HCL 0.5 % OP SOLN
2.0000 [drp] | Freq: Once | OPHTHALMIC | Status: AC
  Administered 2020-07-26: 2 [drp] via OPHTHALMIC
  Filled 2020-07-26: qty 4

## 2020-07-26 NOTE — Discharge Instructions (Addendum)
Foreign body in your right eye was removed with irrigation.  Return to the ER for recurrent or worsening symptoms, persistent vomiting, vision changes or other concerns

## 2020-07-26 NOTE — ED Notes (Signed)
Pt reports the right eye is feeling better following the irrigation. Pt requesting left eye to be looked at by MD. MD made aware.

## 2020-07-26 NOTE — ED Provider Notes (Signed)
[facility] Emergency Department Provider Note   ____________________________________________   Event Date/Time   First MD Initiated Contact with Patient 07/26/20 0151     (approximate)  I have reviewed the triage vital signs and the nursing notes.   HISTORY  Chief Complaint Eye Drainage    HPI David Mcclain is a 40 y.o. male who presents to the ED from home with a chief complaint of right eye irritation.  Patient states he was rubbing his left eye last week but presents to the ED for right eye irritation.  Thinks he has something in his eye.  He does work grinding metal but wears eye protection.  Does not wear contacts or corrective lenses.  Denies blurry vision.  Voices no other complaints or injuries.     Past Medical History:  Diagnosis Date  . Chronic back pain     There are no problems to display for this patient.   Past Surgical History:  Procedure Laterality Date  . FRACTURE SURGERY    . HIP FRACTURE SURGERY      Prior to Admission medications   Medication Sig Start Date End Date Taking? Authorizing Provider  albuterol (VENTOLIN HFA) 108 (90 Base) MCG/ACT inhaler Inhale 2-4 puffs by mouth every 4 hours as needed for wheezing, cough, and/or shortness of breath 07/11/18   Loleta Rose, MD  cyclobenzaprine (FLEXERIL) 10 MG tablet Take 1 tablet (10 mg total) by mouth 3 (three) times daily as needed for muscle spasms. 01/09/16   Cuthriell, Delorise Royals, PA-C  loratadine (CLARITIN) 10 MG tablet Take 1 tablet (10 mg total) by mouth daily. 03/10/16   Darci Current, MD  meloxicam (MOBIC) 15 MG tablet Take 1 tablet (15 mg total) by mouth daily. 01/09/16   Cuthriell, Delorise Royals, PA-C    Allergies Patient has no known allergies.  No family history on file.  Social History Social History   Tobacco Use  . Smoking status: Current Every Day Smoker    Packs/day: 0.50    Types: Cigarettes  . Smokeless tobacco: Never Used  Substance Use  Topics  . Alcohol use: Yes    Review of Systems  Constitutional: No fever/chills Eyes: Positive for right eye irritation. ENT: No sore throat. Cardiovascular: Denies chest pain. Respiratory: Denies shortness of breath. Gastrointestinal: No abdominal pain.  No nausea, no vomiting.  No diarrhea.  No constipation. Genitourinary: Negative for dysuria. Musculoskeletal: Negative for back pain. Skin: Negative for rash. Neurological: Negative for headaches, focal weakness or numbness.   ____________________________________________   PHYSICAL EXAM:  VITAL SIGNS: ED Triage Vitals  Enc Vitals Group     BP 07/25/20 2315 133/83     Pulse Rate 07/25/20 2315 81     Resp 07/25/20 2315 12     Temp 07/25/20 2315 97.8 F (36.6 C)     Temp Source 07/25/20 2315 Oral     SpO2 07/25/20 2315 97 %     Weight 07/25/20 2313 214 lb 15.2 oz (97.5 kg)     Height 07/25/20 2313 6\' 1"  (1.854 m)     Head Circumference --      Peak Flow --      Pain Score 07/25/20 2313 5     Pain Loc --      Pain Edu? --      Excl. in GC? --     Constitutional: Alert and oriented. Well appearing and in no acute distress. Eyes: Right conjunctivae reddened. PERRL. EOMI. Globe intact.  Tiny  white speck resembling tissue to center of pupil.  Right upper eyelid everted for exam; no foreign body noted. Head: Atraumatic. Nose: No congestion/rhinnorhea. Mouth/Throat: Mucous membranes are moist.   Neck: No stridor.   Cardiovascular: Normal rate, regular rhythm. Grossly normal heart sounds.  Good peripheral circulation. Respiratory: Normal respiratory effort.  No retractions. Lungs CTAB. Gastrointestinal: Soft and nontender. No distention. No abdominal bruits. No CVA tenderness. Musculoskeletal: No lower extremity tenderness nor edema.  No joint effusions. Neurologic:  Normal speech and language. No gross focal neurologic deficits are appreciated. No gait instability. Skin:  Skin is warm, dry and intact. No rash  noted. Psychiatric: Mood and affect are normal. Speech and behavior are normal.  ____________________________________________   LABS (all labs ordered are listed, but only abnormal results are displayed)  Labs Reviewed - No data to display ____________________________________________  EKG  None ____________________________________________  RADIOLOGY I, Shameer Molstad J, personally viewed and evaluated these images (plain radiographs) as part of my medical decision making, as well as reviewing the written report by the radiologist.  ED MD interpretation: None  Official radiology report(s): No results found.  ____________________________________________   PROCEDURES  Procedure(s) performed (including Critical Care):  Procedures   ____________________________________________   INITIAL IMPRESSION / ASSESSMENT AND PLAN / ED COURSE  As part of my medical decision making, I reviewed the following data within the electronic MEDICAL RECORD NUMBER Nursing notes reviewed and incorporated, Old chart reviewed and Notes from prior ED visits     40 year old male presenting with right eye irritation.  Clinical Course as of 07/26/20 0357  Sun Jul 26, 2020  0234 Tetracaine drops applied to right eye.  Attempted removal of foreign body with cotton Q-tip which was unsuccessful.  Right eye stained with fluorescein and examined under Woods lamp.  No corneal abrasion.  Will try irrigation with Lequita Halt lens to remove foreign body. [JS]  0351 Right eye reexamined after irrigation with Lequita Halt lens.  Foreign body is gone.  Patient feels significantly better and has looked in the mirror and agrees that foreign body is gone.  Strict return precautions given.  Patient verbalizes understanding agrees with plan of care. [JS]    Clinical Course User Index [JS] Irean Hong, MD     ____________________________________________   FINAL CLINICAL IMPRESSION(S) / ED DIAGNOSES  Final diagnoses:  Foreign  body of right eye, initial encounter     ED Discharge Orders    None      *Please note:  David Mcclain was evaluated in Emergency Department on 07/26/2020 for the symptoms described in the history of present illness. He was evaluated in the context of the global COVID-19 pandemic, which necessitated consideration that the patient might be at risk for infection with the SARS-CoV-2 virus that causes COVID-19. Institutional protocols and algorithms that pertain to the evaluation of patients at risk for COVID-19 are in a state of rapid change based on information released by regulatory bodies including the CDC and federal and state organizations. These policies and algorithms were followed during the patient's care in the ED.  Some ED evaluations and interventions may be delayed as a result of limited staffing during and the pandemic.*   Note:  This document was prepared using Dragon voice recognition software and may include unintentional dictation errors.   Irean Hong, MD 07/26/20 [phone]

## 2020-09-02 IMAGING — DX PORTABLE CHEST - 1 VIEW
1 series · 1 of 1 positions shown · non-contrast
Comparison: 11/09/2016

CLINICAL DATA: Intermittent cough shortness of breath for 1 year.

EXAM:
PORTABLE CHEST 1 VIEW

[chest ap]
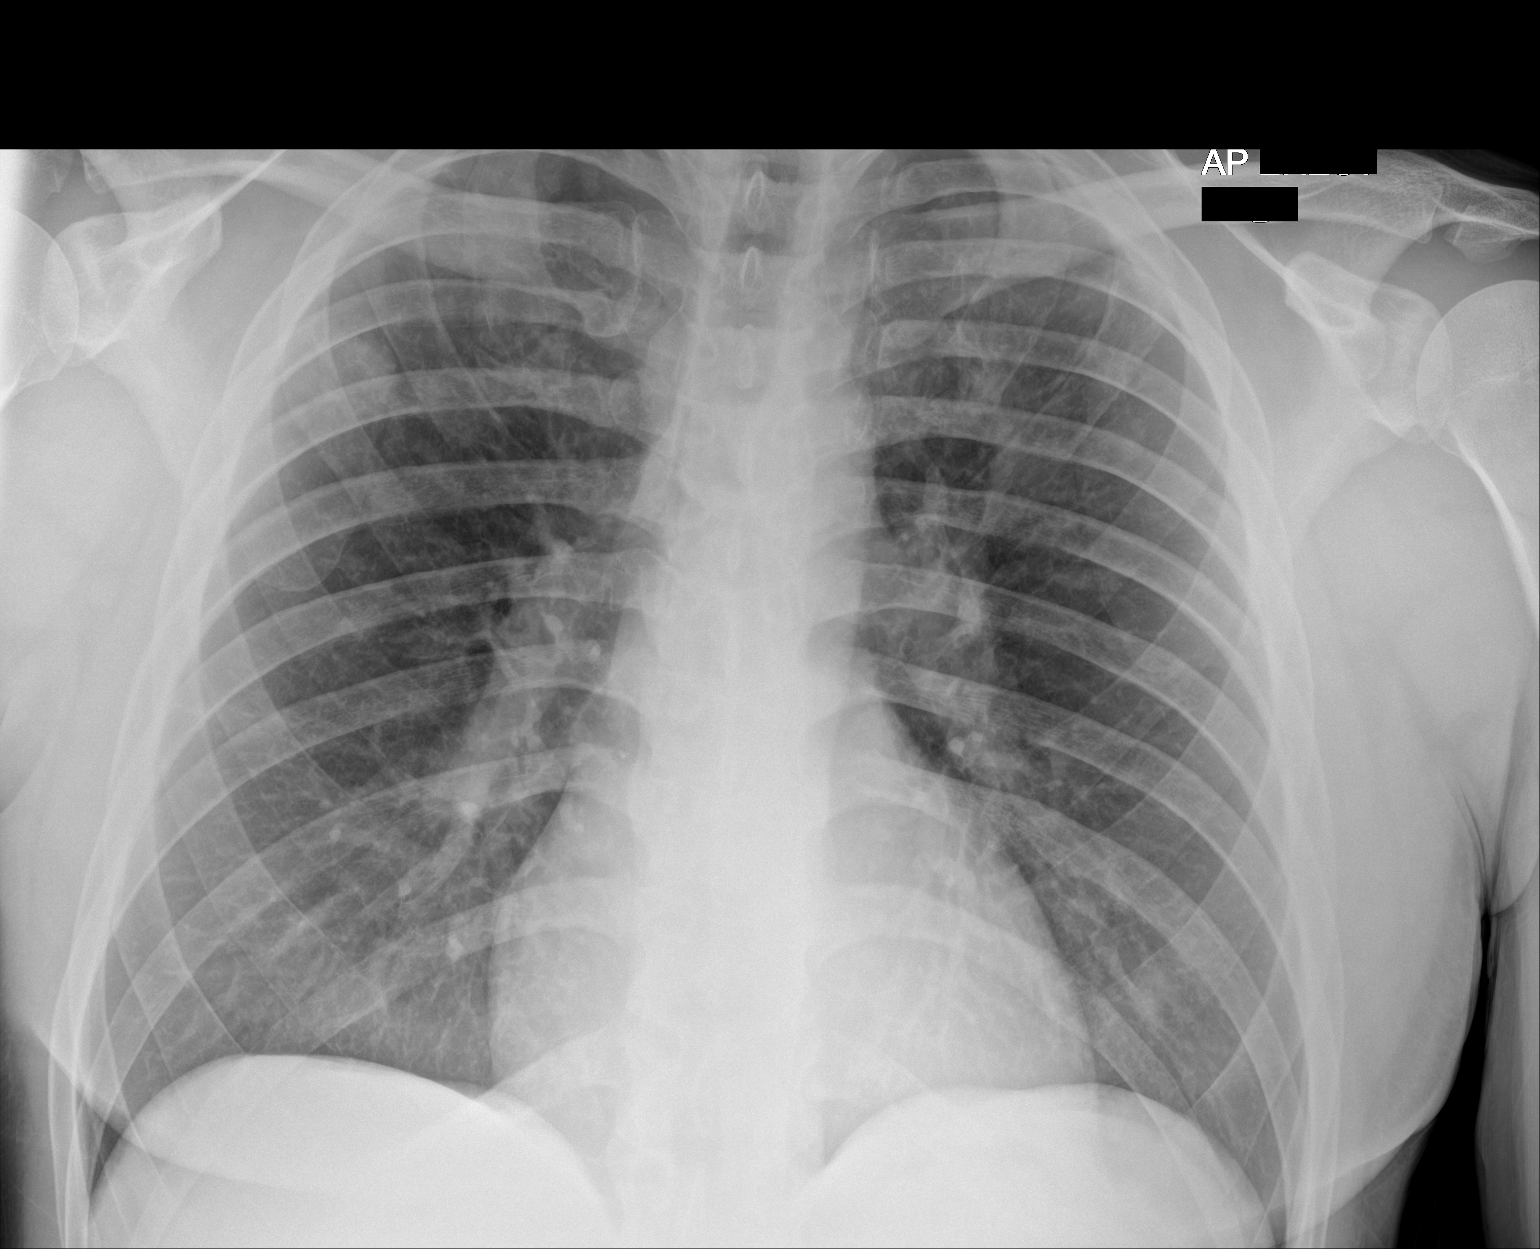

[1 of 1 positions shown; findings below may reference images not displayed]

FINDINGS: The heart size and mediastinal contours are within normal limits.
Previously seen small patchy nodular opacity both upper lobes show
improvement since previous study. No new or worsening areas of
pulmonary opacity are seen. No evidence of pleural effusion.
IMPRESSION: Interval improvement in bilateral upper lobe patchy nodular
opacities since prior exam. No acute findings.

## 2023-07-05 DIAGNOSIS — K029 Dental caries, unspecified: Secondary | ICD-10-CM | POA: Diagnosis not present

## 2023-08-22 ENCOUNTER — Ambulatory Visit (LOCAL_COMMUNITY_HEALTH_CENTER): Payer: Self-pay

## 2023-08-22 DIAGNOSIS — Z111 Encounter for screening for respiratory tuberculosis: Secondary | ICD-10-CM

## 2023-08-24 ENCOUNTER — Ambulatory Visit (LOCAL_COMMUNITY_HEALTH_CENTER)

## 2023-08-24 DIAGNOSIS — Z111 Encounter for screening for respiratory tuberculosis: Secondary | ICD-10-CM

## 2023-08-24 LAB — TB SKIN TEST
Induration: 0 mm
TB Skin Test: NEGATIVE

## 2023-09-20 DIAGNOSIS — M1652 Unilateral post-traumatic osteoarthritis, left hip: Secondary | ICD-10-CM | POA: Diagnosis not present

## 2023-09-20 DIAGNOSIS — M76891 Other specified enthesopathies of right lower limb, excluding foot: Secondary | ICD-10-CM | POA: Diagnosis not present

## 2023-09-20 DIAGNOSIS — M25552 Pain in left hip: Secondary | ICD-10-CM | POA: Diagnosis not present

## 2023-09-20 DIAGNOSIS — M25551 Pain in right hip: Secondary | ICD-10-CM | POA: Diagnosis not present

## 2023-11-02 DIAGNOSIS — Z113 Encounter for screening for infections with a predominantly sexual mode of transmission: Secondary | ICD-10-CM | POA: Diagnosis not present
